# Patient Record
Sex: Male | Born: 1985 | Hispanic: Yes | Marital: Single | Smoking: Current every day smoker
Health system: Southern US, Community
[De-identification: ages and names within clinical notes are randomized; demographics above are authoritative.]

## PROBLEM LIST (undated history)

## (undated) HISTORY — PX: OTHER SURGICAL HISTORY: SHX169

---

## 2013-05-09 ENCOUNTER — Encounter (HOSPITAL_COMMUNITY): Payer: Self-pay | Admitting: Neurology

## 2013-05-09 ENCOUNTER — Emergency Department (HOSPITAL_COMMUNITY): Payer: No Typology Code available for payment source

## 2013-05-09 ENCOUNTER — Inpatient Hospital Stay (HOSPITAL_COMMUNITY)
Admission: EM | Admit: 2013-05-09 | Discharge: 2013-05-10 | DRG: 087 | Disposition: A | Discharge status: Home / Self Care | Payer: No Typology Code available for payment source | Attending: General Surgery | Admitting: General Surgery

## 2013-05-09 ENCOUNTER — Inpatient Hospital Stay (HOSPITAL_COMMUNITY): Payer: No Typology Code available for payment source

## 2013-05-09 DIAGNOSIS — F17200 Nicotine dependence, unspecified, uncomplicated: Secondary | ICD-10-CM | POA: Diagnosis present

## 2013-05-09 DIAGNOSIS — S0003XA Contusion of scalp, initial encounter: Secondary | ICD-10-CM

## 2013-05-09 DIAGNOSIS — M21922 Unspecified acquired deformity of left upper arm: Secondary | ICD-10-CM

## 2013-05-09 DIAGNOSIS — S43109A Unspecified dislocation of unspecified acromioclavicular joint, initial encounter: Secondary | ICD-10-CM | POA: Diagnosis present

## 2013-05-09 DIAGNOSIS — H5500 Unspecified nystagmus: Secondary | ICD-10-CM | POA: Diagnosis present

## 2013-05-09 DIAGNOSIS — S06309A Unspecified focal traumatic brain injury with loss of consciousness of unspecified duration, initial encounter: Secondary | ICD-10-CM

## 2013-05-09 DIAGNOSIS — I629 Nontraumatic intracranial hemorrhage, unspecified: Secondary | ICD-10-CM

## 2013-05-09 DIAGNOSIS — M25519 Pain in unspecified shoulder: Secondary | ICD-10-CM

## 2013-05-09 DIAGNOSIS — S06300A Unspecified focal traumatic brain injury without loss of consciousness, initial encounter: Principal | ICD-10-CM | POA: Diagnosis present

## 2013-05-09 DIAGNOSIS — S0083XA Contusion of other part of head, initial encounter: Secondary | ICD-10-CM

## 2013-05-09 DIAGNOSIS — S43102A Unspecified dislocation of left acromioclavicular joint, initial encounter: Secondary | ICD-10-CM

## 2013-05-09 DIAGNOSIS — S1093XA Contusion of unspecified part of neck, initial encounter: Secondary | ICD-10-CM

## 2013-05-09 LAB — CBC
HCT: 44.6 % (ref 39.0–52.0)
MCH: 31.2 pg (ref 26.0–34.0)
MCHC: 35.2 g/dL (ref 30.0–36.0)
RDW: 13.2 % (ref 11.5–15.5)

## 2013-05-09 LAB — URINALYSIS, ROUTINE W REFLEX MICROSCOPIC
Bilirubin Urine: NEGATIVE
Ketones, ur: NEGATIVE mg/dL
Nitrite: NEGATIVE
Specific Gravity, Urine: 1.044 — ABNORMAL HIGH (ref 1.005–1.030)
Urobilinogen, UA: 0.2 mg/dL (ref 0.0–1.0)
pH: 6 (ref 5.0–8.0)

## 2013-05-09 LAB — COMPREHENSIVE METABOLIC PANEL
ALT: 30 U/L (ref 0–53)
AST: 25 U/L (ref 0–37)
Alkaline Phosphatase: 75 U/L (ref 39–117)
CO2: 24 mEq/L (ref 19–32)
GFR calc Af Amer: >90 mL/min (ref >90)
Glucose, Bld: 117 mg/dL — ABNORMAL HIGH (ref 70–99)
Potassium: 4.1 mEq/L (ref 3.5–5.1)
Sodium: 140 mEq/L (ref 135–145)
Total Protein: 7 g/dL (ref 6.0–8.3)

## 2013-05-09 LAB — POCT I-STAT, CHEM 8
Calcium, Ion: 1.17 mmol/L (ref 1.12–1.23)
Chloride: 108 mEq/L (ref 96–112)
Creatinine, Ser: 0.7 mg/dL (ref 0.50–1.35)
Glucose, Bld: 112 mg/dL — ABNORMAL HIGH (ref 70–99)
HCT: 45 % (ref 39.0–52.0)
Potassium: 4 mEq/L (ref 3.5–5.1)

## 2013-05-09 LAB — CDS SEROLOGY

## 2013-05-09 LAB — PROTIME-INR: INR: 0.99 (ref 0.00–1.49)

## 2013-05-09 MED ORDER — HYDROMORPHONE HCL PF 1 MG/ML IJ SOLN
1.0000 mg | Freq: Once | INTRAMUSCULAR | Status: AC
  Administered 2013-05-09: 1 mg via INTRAVENOUS
  Filled 2013-05-09: qty 1

## 2013-05-09 MED ORDER — ACETAMINOPHEN 325 MG PO TABS: 650.0000 mg | ORAL_TABLET | ORAL | Status: DC | PRN

## 2013-05-09 MED ORDER — IOHEXOL 300 MG/ML  SOLN
100.0000 mL | Freq: Once | INTRAMUSCULAR | Status: AC | PRN
  Administered 2013-05-09: 100 mL via INTRAVENOUS

## 2013-05-09 MED ORDER — HYDROMORPHONE HCL PF 1 MG/ML IJ SOLN
1.0000 mg | INTRAMUSCULAR | Status: DC | PRN
  Administered 2013-05-09 – 2013-05-10 (×4): 1 mg via INTRAVENOUS
  Filled 2013-05-09 (×4): qty 1

## 2013-05-09 MED ORDER — SODIUM CHLORIDE 0.9 % IJ SOLN: 3.0000 mL | INTRAMUSCULAR | Status: DC | PRN

## 2013-05-09 MED ORDER — SODIUM CHLORIDE 0.9 % IJ SOLN
3.0000 mL | Freq: Two times a day (BID) | INTRAMUSCULAR | Status: DC
  Administered 2013-05-09 – 2013-05-10 (×2): 3 mL via INTRAVENOUS

## 2013-05-09 MED ORDER — SODIUM CHLORIDE 0.9 % IV SOLN: 250.0000 mL | INTRAVENOUS | Status: DC | PRN

## 2013-05-09 MED ORDER — TETANUS-DIPHTH-ACELL PERTUSSIS 5-2.5-18.5 LF-MCG/0.5 IM SUSP
0.5000 mL | Freq: Once | INTRAMUSCULAR | Status: AC
  Administered 2013-05-09: 0.5 mL via INTRAMUSCULAR
  Filled 2013-05-09: qty 0.5

## 2013-05-09 MED ORDER — TETANUS-DIPHTHERIA TOXOIDS TD 5-2 LFU IM INJ
0.5000 mL | INJECTION | Freq: Once | INTRAMUSCULAR | Status: DC
  Filled 2013-05-09: qty 0.5

## 2013-05-09 MED ORDER — OXYCODONE HCL 5 MG PO TABS
5.0000 mg | ORAL_TABLET | ORAL | Status: DC | PRN
  Filled 2013-05-09: qty 1

## 2013-05-09 NOTE — ED Notes (Signed)
Lactic acid results shown to dr. Manus Gunning

## 2013-05-09 NOTE — Progress Notes (Signed)
Orthopedic Tech Progress Note Patient Details:  Cody Paul [birth year] [id number]  Ortho Devices Type of Ortho Device: Sling immobilizer Ortho Device/Splint Location: left arm Ortho Device/Splint Interventions: Application   Nikki Dom 05/09/2013, 3:37 PM

## 2013-05-09 NOTE — Progress Notes (Signed)
Orthopedic Tech Progress Note Patient Details:  Cody Paul 10/02/1986 [id number]  Ortho Devices Type of Ortho Device: Sling immobilizer Ortho Device/Splint Location: left arm Ortho Device/Splint Interventions: Application   Daleen Steinhaus 05/09/2013, 3:37 PM  
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
## 2013-05-09 NOTE — ED Notes (Signed)
Per EDP, c-spine cleared, can take off c-collar. Reporting thoracic and lumbar spine cleared as well.

## 2013-05-09 NOTE — Consult Note (Signed)
Seen and agree with above.  Will try to get upright XRs with weight to assess displacement.
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
## 2013-05-10 ENCOUNTER — Inpatient Hospital Stay (HOSPITAL_COMMUNITY): Payer: No Typology Code available for payment source

## 2013-05-10 LAB — CBC
HCT: 42.4 % (ref 39.0–52.0)
MCHC: 34.9 g/dL (ref 30.0–36.0)
Platelets: 175 10*3/uL (ref 150–400)
RDW: 13.3 % (ref 11.5–15.5)

## 2013-05-10 MED ORDER — ACETAMINOPHEN 325 MG PO TABS: 650.0000 mg | ORAL_TABLET | ORAL | Status: DC | PRN

## 2013-05-10 MED ORDER — OXYCODONE HCL 10 MG PO TABS: 10.0000 mg | ORAL_TABLET | ORAL | Status: AC | PRN

## 2013-05-10 MED ORDER — OXYCODONE HCL 5 MG PO TABS
10.0000 mg | ORAL_TABLET | ORAL | Status: DC | PRN
  Administered 2013-05-10: 10 mg via ORAL
  Administered 2013-05-10: 20 mg via ORAL
  Administered 2013-05-10 (×2): 10 mg via ORAL
  Filled 2013-05-10: qty 4
  Filled 2013-05-10 (×3): qty 2
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
## 2013-05-10 NOTE — Progress Notes (Signed)
CT shows no identifiable hemorrhage in the temporal area.  May be able to discharge later today.  This patient has been seen and I agree with the findings and treatment plan.  Marta Lamas. Gae Bon, MD, FACS [phone] (pager) [phone] (direct pager) Trauma Surgeon

## 2013-05-10 NOTE — Progress Notes (Signed)
PATIENT ID: Cody Paul        Subjective: Shoulder pain improved some today.  Up with PT today.    Objective:  Filed Vitals:   05/10/13 1300  BP: 106/52  Pulse: 63  Temp:   Resp: 14     L shoulder deformity improved  Skin intact Sling intact Distally NVI  Labs:   Recent Labs  05/09/13 1000 05/09/13 1014 05/10/13 0518  HGB 15.7 15.3 14.8   Recent Labs  05/09/13 1000 05/09/13 1014 05/10/13 0518  WBC 5.7  --  8.2  RBC 5.04  --  4.70  HCT 44.6 45.0 42.4  PLT 168  --  175   Recent Labs  05/09/13 1000 05/09/13 1014  NA 140 142  K 4.1 4.0  CL 106 108  CO2 24  --   BUN 18 19  CREATININE 0.73 0.70  GLUCOSE 117* 112*  CALCIUM 8.8  --     Assessment and Plan: Grade 3 AC separation Sling F/u 1-2 wks in my office for recheck LIkely to do well without surgical treatment

## 2013-05-10 NOTE — Progress Notes (Signed)
PT G-Code Note  05/10/13 1342  PT G-Codes **NOT FOR INPATIENT CLASS**  Functional Assessment Tool Used Clinical Judgement  Functional Limitation Self care  Self Care Current Status [id number]) CJ  Self Care Goal Status [id number]) CI                 Mora, [place] [phone] 05/10/2013
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
## 2013-05-10 NOTE — Progress Notes (Signed)
Physical Therapy Treatment Patient Details Name: Cody Paul MRN: [id number] DOB: 1986/10/07 Today's Date: 05/10/2013 Time: 1324-4010 PT Time Calculation (min): 24 min  PT Assessment / Plan / Recommendation Comments on Treatment Session  Patient educated on HEP for elbow, wrist and hand ROM and shoulder PROM; however upon speaking with ortho PA after session discontinued shoulder motion exercises and educated pt to keep shoulder still till follow up with MD as outpatient.  Pt verbalized understanding.    Follow Up Recommendations  No PT follow up (till seen by ortho MD)           Equipment Recommendations  None recommended by PT       Frequency Min 3X/week   Plan Discharge plan remains appropriate    Precautions / Restrictions Precautions Precautions: Shoulder Type of Shoulder Precautions: sling for comfort   Pertinent Vitals/Pain Mod c/o w/exercise (ice and sling repositioned/applied)    Mobility  Bed Mobility Supine to Sit: 7: Independent Sit to Supine: 7: Independent Transfers Sit to Stand: 6: Modified independent (Device/Increase time) Stand to Sit: 6: Modified independent (Device/Increase time) Ambulation/Gait Ambulation/Gait Assistance: Not tested (comment)    Exercises General Exercises - Upper Extremity Shoulder Flexion: PROM;Seated;5 reps;Left Shoulder ABduction: Seated;PROM;5 reps;Left Shoulder Horizontal ABduction: 5 reps;PROM;Supine;Left Elbow Flexion: AROM;Left;5 reps Elbow Extension: AROM;Left;5 reps Wrist Flexion: 5 reps;AROM;Left Wrist Extension: Left;5 reps;AROM Digit Composite Flexion: AROM;5 reps;Left Composite Extension: AROM;5 reps;Left   PT Diagnosis: Acute pain  PT Problem List: Decreased range of motion;Decreased strength;Pain PT Treatment Interventions: Therapeutic exercise;Patient/family education;Modalities   PT Goals Acute Rehab PT Goals PT Goal Formulation: With patient Time For Goal Achievement: 05/17/13 Potential to  Achieve Goals: Good Pt will Perform Home Exercise Program: Independently PT Goal: Perform Home Exercise Program - Progress: Progressing toward goal Additional Goals Additional Goal #1: Pt to demonstrate self ROM left shoulder to at least 50 degrees flexion and 80 degrees abduction for improved ADL's. PT Goal: Additional Goal #1 - Progress: Discontinued (comment) (due to PA recommendation keep shoulder still)  Visit Information  Last PT Received On: 05/10/13    Subjective Data  Subjective: Still hurts   Cognition  Cognition Arousal/Alertness: Awake/alert Behavior During Therapy: WFL for tasks assessed/performed Overall Cognitive Status: Within Functional Limits for tasks assessed    Balance     End of Session PT - End of Session Equipment Utilized During Treatment: Other (comment) (sling) Activity Tolerance: Patient tolerated treatment well Patient left: in bed;with call bell/phone within reach   GP     [facility] 05/10/2013, 5:36 PM [place], [place] [phone] 05/10/2013
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
## 2021-10-22 ENCOUNTER — Encounter (HOSPITAL_COMMUNITY): Payer: Self-pay

## 2021-10-22 ENCOUNTER — Ambulatory Visit (HOSPITAL_COMMUNITY)
Admission: EM | Admit: 2021-10-22 | Discharge: 2021-10-22 | Disposition: A | Discharge status: Home / Self Care | Payer: Self-pay | Attending: Physician Assistant | Admitting: Physician Assistant

## 2021-10-22 ENCOUNTER — Other Ambulatory Visit: Payer: Self-pay

## 2021-10-22 DIAGNOSIS — R509 Fever, unspecified: Secondary | ICD-10-CM | POA: Insufficient documentation

## 2021-10-22 DIAGNOSIS — R42 Dizziness and giddiness: Secondary | ICD-10-CM

## 2021-10-22 DIAGNOSIS — J069 Acute upper respiratory infection, unspecified: Secondary | ICD-10-CM

## 2021-10-22 DIAGNOSIS — F17210 Nicotine dependence, cigarettes, uncomplicated: Secondary | ICD-10-CM | POA: Insufficient documentation

## 2021-10-22 DIAGNOSIS — R051 Acute cough: Secondary | ICD-10-CM

## 2021-10-22 DIAGNOSIS — R112 Nausea with vomiting, unspecified: Secondary | ICD-10-CM | POA: Insufficient documentation

## 2021-10-22 DIAGNOSIS — Z20822 Contact with and (suspected) exposure to covid-19: Secondary | ICD-10-CM | POA: Insufficient documentation

## 2021-10-22 LAB — POC INFLUENZA A AND B ANTIGEN (URGENT CARE ONLY)
INFLUENZA A ANTIGEN, POC: NEGATIVE
INFLUENZA B ANTIGEN, POC: NEGATIVE

## 2021-10-22 MED ORDER — PROMETHAZINE-DM 6.25-15 MG/5ML PO SYRP: 5.0000 mL | ORAL_SOLUTION | Freq: Two times a day (BID) | ORAL | 0 refills | Status: DC | PRN

## 2021-10-22 NOTE — Discharge Instructions (Signed)
We will contact you if you are positive for COVID.  You were negative for flu.  Make sure to drink plenty of fluid.  Use cough medicine twice daily as needed.  This make you sleepy do not drive or drink alcohol with taking it.  If you have any worsening symptoms please return for reevaluation.
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
## 2021-10-22 NOTE — ED Triage Notes (Signed)
Pt reports fever, cough x 2-3 days; vomiting 1 time this morning, feeling lightheaded since this morning. Pt reports her niece and sister in law tested positive for Flu 3 days ago.

## 2021-10-23 LAB — SARS CORONAVIRUS 2 (TAT 6-24 HRS): SARS Coronavirus 2: NEGATIVE

## 2023-12-31 ENCOUNTER — Encounter (HOSPITAL_COMMUNITY): Payer: Self-pay

## 2023-12-31 ENCOUNTER — Emergency Department (HOSPITAL_COMMUNITY): Payer: Self-pay

## 2023-12-31 ENCOUNTER — Emergency Department (HOSPITAL_COMMUNITY)
Admission: EM | Admit: 2023-12-31 | Discharge: 2023-12-31 | Disposition: A | Discharge status: Home / Self Care | Payer: Self-pay | Attending: Emergency Medicine | Admitting: Emergency Medicine

## 2023-12-31 ENCOUNTER — Other Ambulatory Visit: Payer: Self-pay

## 2023-12-31 DIAGNOSIS — R3129 Other microscopic hematuria: Secondary | ICD-10-CM | POA: Insufficient documentation

## 2023-12-31 DIAGNOSIS — R109 Unspecified abdominal pain: Secondary | ICD-10-CM | POA: Insufficient documentation

## 2023-12-31 DIAGNOSIS — D72829 Elevated white blood cell count, unspecified: Secondary | ICD-10-CM | POA: Insufficient documentation

## 2023-12-31 DIAGNOSIS — Z20822 Contact with and (suspected) exposure to covid-19: Secondary | ICD-10-CM | POA: Insufficient documentation

## 2023-12-31 DIAGNOSIS — J111 Influenza due to unidentified influenza virus with other respiratory manifestations: Secondary | ICD-10-CM

## 2023-12-31 DIAGNOSIS — J101 Influenza due to other identified influenza virus with other respiratory manifestations: Secondary | ICD-10-CM | POA: Insufficient documentation

## 2023-12-31 DIAGNOSIS — R0781 Pleurodynia: Secondary | ICD-10-CM | POA: Insufficient documentation

## 2023-12-31 DIAGNOSIS — F17200 Nicotine dependence, unspecified, uncomplicated: Secondary | ICD-10-CM | POA: Insufficient documentation

## 2023-12-31 LAB — TROPONIN I (HIGH SENSITIVITY): Troponin I (High Sensitivity): 3 ng/L (ref <18)

## 2023-12-31 LAB — URINALYSIS, ROUTINE W REFLEX MICROSCOPIC
Bacteria, UA: NONE SEEN
Bilirubin Urine: NEGATIVE
Glucose, UA: NEGATIVE mg/dL
Ketones, ur: 20 mg/dL — AB
Leukocytes,Ua: NEGATIVE
Nitrite: NEGATIVE
Protein, ur: 30 mg/dL — AB
Specific Gravity, Urine: 1.033 — ABNORMAL HIGH (ref 1.005–1.030)
pH: 5 (ref 5.0–8.0)

## 2023-12-31 LAB — GROUP A STREP BY PCR: Group A Strep by PCR: NOT DETECTED

## 2023-12-31 LAB — BASIC METABOLIC PANEL
Anion gap: 12 (ref 5–15)
BUN: 13 mg/dL (ref 6–20)
CO2: 21 mmol/L — ABNORMAL LOW (ref 22–32)
Calcium: 9.4 mg/dL (ref 8.9–10.3)
Chloride: 101 mmol/L (ref 98–111)
Creatinine, Ser: 0.9 mg/dL (ref 0.61–1.24)
GFR, Estimated: >60 mL/min (ref >60)
Glucose, Bld: 113 mg/dL — ABNORMAL HIGH (ref 70–99)
Potassium: 3.8 mmol/L (ref 3.5–5.1)
Sodium: 134 mmol/L — ABNORMAL LOW (ref 135–145)

## 2023-12-31 LAB — RESP PANEL BY RT-PCR (RSV, FLU A&B, COVID)  RVPGX2
Influenza A by PCR: POSITIVE — AB
Influenza B by PCR: NEGATIVE
Resp Syncytial Virus by PCR: NEGATIVE
SARS Coronavirus 2 by RT PCR: NEGATIVE

## 2023-12-31 LAB — CBC
HCT: 50.1 % (ref 39.0–52.0)
Hemoglobin: 17.5 g/dL — ABNORMAL HIGH (ref 13.0–17.0)
MCH: 30.7 pg (ref 26.0–34.0)
MCHC: 34.9 g/dL (ref 30.0–36.0)
MCV: 87.9 fL (ref 80.0–100.0)
Platelets: 232 10*3/uL (ref 150–400)
RBC: 5.7 MIL/uL (ref 4.22–5.81)
RDW: 12.5 % (ref 11.5–15.5)
WBC: 10.8 10*3/uL — ABNORMAL HIGH (ref 4.0–10.5)
nRBC: 0 % (ref 0.0–0.2)

## 2023-12-31 MED ORDER — KETOROLAC TROMETHAMINE 15 MG/ML IJ SOLN
15.0000 mg | Freq: Once | INTRAMUSCULAR | Status: AC
  Administered 2023-12-31: 15 mg via INTRAVENOUS
  Filled 2023-12-31: qty 1

## 2023-12-31 MED ORDER — OSELTAMIVIR PHOSPHATE 75 MG PO CAPS
75.0000 mg | ORAL_CAPSULE | Freq: Two times a day (BID) | ORAL | 0 refills | Status: AC
Start: 2023-12-31 — End: ?

## 2023-12-31 MED ORDER — IOHEXOL 350 MG/ML SOLN
75.0000 mL | Freq: Once | INTRAVENOUS | Status: AC | PRN
  Administered 2023-12-31: 75 mL via INTRAVENOUS

## 2023-12-31 MED ORDER — LIDOCAINE 5 % EX PTCH
1.0000 | MEDICATED_PATCH | CUTANEOUS | Status: DC
  Administered 2023-12-31: 1 via TRANSDERMAL
  Filled 2023-12-31: qty 1

## 2023-12-31 MED ORDER — ACETAMINOPHEN 500 MG PO TABS
1000.0000 mg | ORAL_TABLET | Freq: Once | ORAL | Status: AC
  Administered 2023-12-31: 1000 mg via ORAL
  Filled 2023-12-31: qty 2

## 2023-12-31 NOTE — ED Triage Notes (Signed)
 Pt c.o left sided flank pain x3 days as well as fever cough and nasal congestion.

## 2023-12-31 NOTE — ED Notes (Signed)
 Patient returned from CT via stretcher.

## 2023-12-31 NOTE — ED Provider Notes (Signed)
 Cody Paul Provider Note   CSN: [id number] Arrival date & time: 12/31/23  1405     History Chief Complaint  Patient presents with   Flank Pain   Cough   Nasal Congestion    Cody Paul is a 38 y.o. male reportedly otherwise healthy presents to the ER today for evaluation of cough and cold symptoms. He is also reporting left flank pain, worse with movement.  He reports cough, subjective fever, nasal congestion, body aches.  Denies any abdominal pain, nausea, vomiting, chest pain, shortness of breath.  Denies any dysuria hematuria.  Reports his flank pain is worse with coughing.  He has tried over-the-counter Tylenol  yesterday without much relief of symptoms.  Reports daily tobacco use with a pack per day.  Occasional marijuana and cocaine use.  Rare alcohol use.  Spanish interpreter used during this encounter.   Flank Pain Pertinent negatives include no chest pain, no abdominal pain, no headaches and no shortness of breath.  Cough Associated symptoms: fever (Subjective), myalgias and rhinorrhea   Associated symptoms: no chest pain, no ear pain, no eye discharge, no headaches, no rash, no shortness of breath and no sore throat        Home Medications Prior to Admission medications   Medication Sig Start Date End Date Taking? Authorizing Provider  Acetaminophen  (TEMPRA 1 PO) Take 1 tablet by mouth daily as needed (for pain).   Yes [provider]      Allergies    Patient has no known allergies.    Review of Systems   Review of Systems  Constitutional:  Positive for fever (Subjective).  HENT:  Positive for congestion and rhinorrhea. Negative for drooling, ear pain, sore throat and trouble swallowing.   Eyes:  Negative for photophobia, discharge and visual disturbance.  Respiratory:  Positive for cough. Negative for shortness of breath.   Cardiovascular:  Negative for chest pain and palpitations.  Gastrointestinal:   Negative for abdominal pain, diarrhea, nausea and vomiting.  Genitourinary:  Positive for flank pain. Negative for dysuria, hematuria, penile discharge, penile pain, penile swelling, scrotal swelling and testicular pain.  Musculoskeletal:  Positive for myalgias. Negative for arthralgias, back pain and joint swelling.  Skin:  Negative for color change and rash.  Neurological:  Negative for syncope, weakness and headaches.    Physical Exam Updated Vital Signs BP 104/79 (BP Location: Left Arm)   Pulse (!) 106   Temp 98.9 F (37.2 C)   Resp 16   Ht 5' 11 (1.803 m)   Wt 83.9 kg   SpO2 97%   BMI 25.80 kg/m  Physical Exam Vitals and nursing note reviewed.  Constitutional:      General: He is not in acute distress.    Appearance: He is not ill-appearing or toxic-appearing.  HENT:     Head: Normocephalic and atraumatic.     Right Ear: Tympanic membrane, ear canal and external ear normal.     Left Ear: Tympanic membrane, ear canal and external ear normal.     Nose:     Comments: Bilateral nasal turbinate edema and erythema with scant clear nasal discharge.    Mouth/Throat:     Mouth: Mucous membranes are moist.     Comments: No pharyngeal erythema, exudate, or edema noted.  Uvula midline.  Airway patent.  Moist mucous membranes. Eyes:     General: No scleral icterus.    Conjunctiva/sclera: Conjunctivae normal.  Cardiovascular:     Rate  and Rhythm: Normal rate.  Pulmonary:     Effort: Pulmonary effort is normal. No respiratory distress.     Breath sounds: Normal breath sounds. No wheezing.  Abdominal:     General: Abdomen is flat.     Palpations: Abdomen is soft.     Tenderness: There is abdominal tenderness. There is no right CVA tenderness, left CVA tenderness, guarding or rebound.       Comments: Tenderness to the marked area above.  No overlying skin changes noted.  No step-offs or deformities.  No CVA tenderness.  No abdominal tenderness.  Abdomen soft.  No guarding or  rebound.  Musculoskeletal:        General: No deformity.     Cervical back: Normal range of motion. No rigidity.  Skin:    General: Skin is warm and dry.  Neurological:     General: No focal deficit present.     Mental Status: He is alert.     ED Results / Procedures / Treatments   Labs (all labs ordered are listed, but only abnormal results are displayed) Labs Reviewed  RESP PANEL BY RT-PCR (RSV, FLU A&B, COVID)  RVPGX2 - Abnormal; Notable for the following components:      Result Value   Influenza A by PCR POSITIVE (*)    All other components within normal limits  CBC - Abnormal; Notable for the following components:   WBC 10.8 (*)    Hemoglobin 17.5 (*)    All other components within normal limits  BASIC METABOLIC PANEL - Abnormal; Notable for the following components:   Sodium 134 (*)    CO2 21 (*)    Glucose, Bld 113 (*)    All other components within normal limits  URINALYSIS, ROUTINE W REFLEX MICROSCOPIC  TROPONIN I (HIGH SENSITIVITY)  TROPONIN I (HIGH SENSITIVITY)    EKG None  Radiology CT ABDOMEN PELVIS W CONTRAST Result Date: 12/31/2023 CLINICAL DATA:  Left-sided abdominal and flank pain. EXAM: CT ABDOMEN AND PELVIS WITH CONTRAST TECHNIQUE: Multidetector CT imaging of the abdomen and pelvis was performed using the standard protocol following bolus administration of intravenous contrast. RADIATION DOSE REDUCTION: This exam was performed according to the departmental dose-optimization program which includes automated exposure control, adjustment of the mA and/or kV according to patient size and/or use of iterative reconstruction technique. CONTRAST:  75mL OMNIPAQUE  IOHEXOL  350 MG/ML SOLN COMPARISON:  None Available. FINDINGS: Lower chest: There are minimal patchy ground-glass opacities in the right lower lobe, likely infectious/inflammatory. Hepatobiliary: No focal liver abnormality is seen. No gallstones, gallbladder wall thickening, or biliary dilatation. Pancreas:  Unremarkable. No pancreatic ductal dilatation or surrounding inflammatory changes. Spleen: Normal in size without focal abnormality. Adrenals/Urinary Tract: Adrenal glands are unremarkable. Kidneys are normal, without renal calculi, focal lesion, or hydronephrosis. Bladder is unremarkable. Stomach/Bowel: Stomach is within normal limits. Appendix appears normal. No evidence of bowel wall thickening, distention, or inflammatory changes. Vascular/Lymphatic: No significant vascular findings are present. No enlarged abdominal or pelvic lymph nodes. Reproductive: Prostate is unremarkable. Other: No abdominal wall hernia or abnormality. No abdominopelvic ascites. Musculoskeletal: No acute or significant osseous findings. IMPRESSION: 1. No acute localizing process in the abdomen or pelvis. 2. Minimal patchy ground-glass opacities in the right lower lobe, likely infectious/inflammatory. Electronically Signed   By: Greig Pique M.D.   On: 12/31/2023 22:21   DG Chest 2 View Result Date: 12/31/2023 CLINICAL DATA:  cough, left sided pain EXAM: CHEST - 2 VIEW COMPARISON:  None Available. FINDINGS: Cardiac silhouette is  unremarkable. No pneumothorax or pleural effusion. The lungs are clear. The visualized skeletal structures are unremarkable. IMPRESSION: No acute cardiopulmonary process. Electronically Signed   By: Fonda Field M.D.   On: 12/31/2023 17:17      Procedures Procedures   Medications Ordered in ED Medications  ketorolac  (TORADOL ) 15 MG/ML injection 15 mg (15 mg Intravenous Given 12/31/23 1831)    ED Course/ Medical Decision Making/ A&P    Medical Decision Making Amount and/or Complexity of Data Reviewed Labs: ordered. Radiology: ordered.  Risk OTC drugs. Prescription drug management.   38 y.o. male presents to the ER for evaluation of FLS and flank pain. Differential diagnosis includes but is not limited to AAA, renal artery/vein embolism/thrombosis, mesenteric ischemia, pyelonephritis,  nephrolithiasis, cystitis, biliary colic, pancreatitis, perforated peptic ulcer, appendicitis, diverticulitis, bowel obstruction, viral illness, pneumonia, bronchitis. Vital signs patient initially febrile, now unremarkable. Physical exam as noted above.   I independently reviewed and interpreted the patient's labs.  Strep negative.  Troponin at normal limits.  CBC shows mildly elevated white blood cell count at 10.8.  Mildly concentrated hemoglobin at 17.5.  BMP shows sodium 134, bicarb of 21, glucose 113.  Respiratory panel positive for influenza A.  Urinalysis shows amber, hazy urine is concentrated.  Small mono hemoglobin with 20 ketones present.  30 protein as well as 21-50 red blood cells but no white blood cells or bacteria present.  CXR no acute cardiopulmonary process. Per radiologist's interpretation.    CT Abd 1. No acute localizing process in the abdomen or pelvis. 2. Minimal patchy ground-glass opacities in the right lower lobe, likely infectious/inflammatory.  Per radiologist's interpretation.    Given the blood in the patient's urine, could have recently passed a kidney stone and also may be having some muscular pain from coughing.  Pain is reproducible upon palpation.  He reports improvement after medications.  Lidocaine  patch given as well.  The patient is likely experiencing typical body aches associated with flu and fever.  He is not having any chest pain or shortness of breath.  He speaking in full sentences, vital signs are stable.  I think the patchy opacity is likely from the flu.  Lung sounds are clear.  Will recommended he follow-up with urology given the microscopic hematuria.  We have discussed risk and benefits of Tamiflu .  Formation on Tamiflu  including the discharge of work.  Recommended supportive care.  We discussed the results of the labs/imaging. The plan is supportive care. We discussed strict return precautions and red flag symptoms. The patient verbalized their  understanding and agrees to the plan. The patient is stable and being discharged home in good condition.  Portions of this report may have been transcribed using voice recognition software. Every effort was made to ensure accuracy; however, inadvertent computerized transcription errors may be present.   Final Clinical Impression(s) / ED Diagnoses Final diagnoses:  Flu  Rib pain  Microscopic hematuria    Rx / DC Orders ED Discharge Orders          Ordered    oseltamivir  (TAMIFLU ) 75 MG capsule  Every 12 hours        12/31/23 2311              Bernis Ernst, PA-C 01/04/24 0730    Franklyn Sid SAILOR, MD 01/04/24 [id number]

## 2023-12-31 NOTE — Discharge Instructions (Addendum)
 You were seen in the emerged from today for evaluation of cough and cold symptoms.  You had the fluid you likely think this was causing your symptoms.  You can take over-the-counter cough and cold medication for this.  I have also sent a prescription for Tamiflu .  Please read more information on Tamiflu .  The flu is contagious so please make sure you are staying at home.  Make sure you are staying well-hydrated drinking plenty of fluids mainly water.  Take Tylenol  and ibuprofen for your rib pain as well as help with your body aches and fever.  Please make sure not to take any more Tylenol  or ibuprofen the recommended if you are going to be taking over-the-counter medications as is already has this active ingredient in it. Additionally, you have some blood in your urine. Please follow up with a urologist. Please make sure you follow with your primary care doctor.  If you have any concerns, new or worsening symptoms, please return to your nearest emergency department for evaluation.  -------------------------  Fue atendido en el emergido a partir de hoy para evaluacin de sntomas de tos y resfriado.  Tena el lquido y probablemente cree que estaba causando sus sntomas.  Para ello, puede tomar medicamentos de venta libre para la tos y el resfriado.  Tambin le he enviado una receta de Tamiflu .  Lea ms informacin sobre Tamiflu .  La gripe es contagiosa, as que asegrese de engineering geologist.  Asegrese de sunoco hidratado y beba muchos lquidos, principalmente agua.  Tome Tylenol  e ibuprofeno para el dolor de las costillas y tambin para los dolores corporales y la [place].  Asegrese de no tomar ms Tylenol  o ibuprofeno de lo recomendado si va a tomar medicamentos de [address], ya que ya contienen este ingrediente activo. Adems, tiene algo de sangre en la orina. Por favor haga un seguimiento con un urlogo. Asegrese de science writer con su mdico de atencin primaria.  Si tiene alguna inquietud,  sntomas nuevos o que [place], regrese al departamento de emergencias ms cercano para una evaluacin.  Comunquese con un mdico si: Tiene sntomas nuevos. Tiene los siguientes sntomas: Dolor de [place]. Materia fecal lquida (diarrea). [place]. La tos empeora. Empieza a tener ms mucosidad. Tiene programme researcher, broadcasting/film/video. Vomita. Solicite ayuda de inmediato si: Le falta el aire. Tiene dificultad para respirar. La piel o las uas se ponen de un color azulado. Presenta dolor muy intenso o rigidez en el cuello. Tiene dolor de cabeza repentino. Le duele la cara o el odo de forma repentina. No puede comer ni beber sin vomitar. Estos sntomas pueden representar un problema grave que constituye radio broadcast assistant. Solicite atencin mdica de inmediato. Comunquese con el servicio de emergencias de su localidad (911 en los [place]). No espere a ver si los sntomas desaparecen. No conduzca por sus propios medios officemax incorporated.

## 2023-12-31 NOTE — ED Notes (Signed)
 Patient transported to CT via stretcher.
